# Patient Record
Sex: Male | Born: 1985 | Race: White | Hispanic: No | Marital: Married | Smoking: Never smoker
Health system: Southern US, Community
[De-identification: ages and names within clinical notes are randomized; demographics above are authoritative.]

## PROBLEM LIST (undated history)

## (undated) HISTORY — PX: FACIAL RECONSTRUCTION SURGERY: SHX631

---

## 2006-07-17 ENCOUNTER — Emergency Department: Payer: Self-pay | Admitting: Emergency Medicine
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
## 2012-12-30 ENCOUNTER — Emergency Department: Payer: Self-pay | Admitting: Emergency Medicine

## 2012-12-30 LAB — BASIC METABOLIC PANEL
Calcium, Total: 8.8 mg/dL (ref 8.5–10.1)
Chloride: 107 mmol/L (ref 98–107)
Creatinine: 1.05 mg/dL (ref 0.60–1.30)
EGFR (African American): >60
EGFR (Non-African Amer.): >60
Osmolality: 278 (ref 275–301)
Potassium: 4 mmol/L (ref 3.5–5.1)

## 2012-12-30 LAB — DRUG SCREEN, URINE
Amphetamines, Ur Screen: NEGATIVE (ref ?–1000)
Barbiturates, Ur Screen: NEGATIVE (ref ?–200)
Cocaine Metabolite,Ur ~~LOC~~: NEGATIVE (ref ?–300)
MDMA (Ecstasy)Ur Screen: NEGATIVE (ref ?–500)
Opiate, Ur Screen: NEGATIVE (ref ?–300)
Tricyclic, Ur Screen: NEGATIVE (ref ?–1000)

## 2012-12-30 LAB — CK TOTAL AND CKMB (NOT AT ARMC)
CK, Total: 626 U/L — ABNORMAL HIGH (ref 35–232)
CK-MB: 1.5 ng/mL (ref 0.5–3.6)

## 2012-12-30 LAB — CBC
HCT: 44.7 % (ref 40.0–52.0)
HGB: 15.6 g/dL (ref 13.0–18.0)
MCH: 30.2 pg (ref 26.0–34.0)
MCHC: 35 g/dL (ref 32.0–36.0)
MCV: 86 fL (ref 80–100)
Platelet: 212 10*3/uL (ref 150–440)
WBC: 7.5 10*3/uL (ref 3.8–10.6)

## 2012-12-30 LAB — TROPONIN I: Troponin-I: <0.02 ng/mL

## 2012-12-30 LAB — TSH: Thyroid Stimulating Horm: 3.24 u[IU]/mL

## 2014-01-12 IMAGING — CR DG CHEST 2V
1 series · 2 of 2 positions shown · non-contrast
Comparison: none

REASON FOR EXAM: Chest Pain
COMMENTS:

PROCEDURE:     DXR - DXR CHEST PA (OR AP) AND LATERAL  - December 30, 2012  [DATE]
RESULT:     The lungs are clear. The heart and pulmonary vessels are normal.
The bony and mediastinal structures are unremarkable. There is no effusion.
There is no pneumothorax or evidence of congestive failure.

[Series 1: w chest pa · 0.14mm/px · 2 of 2 slices shown]
[im 1/2]
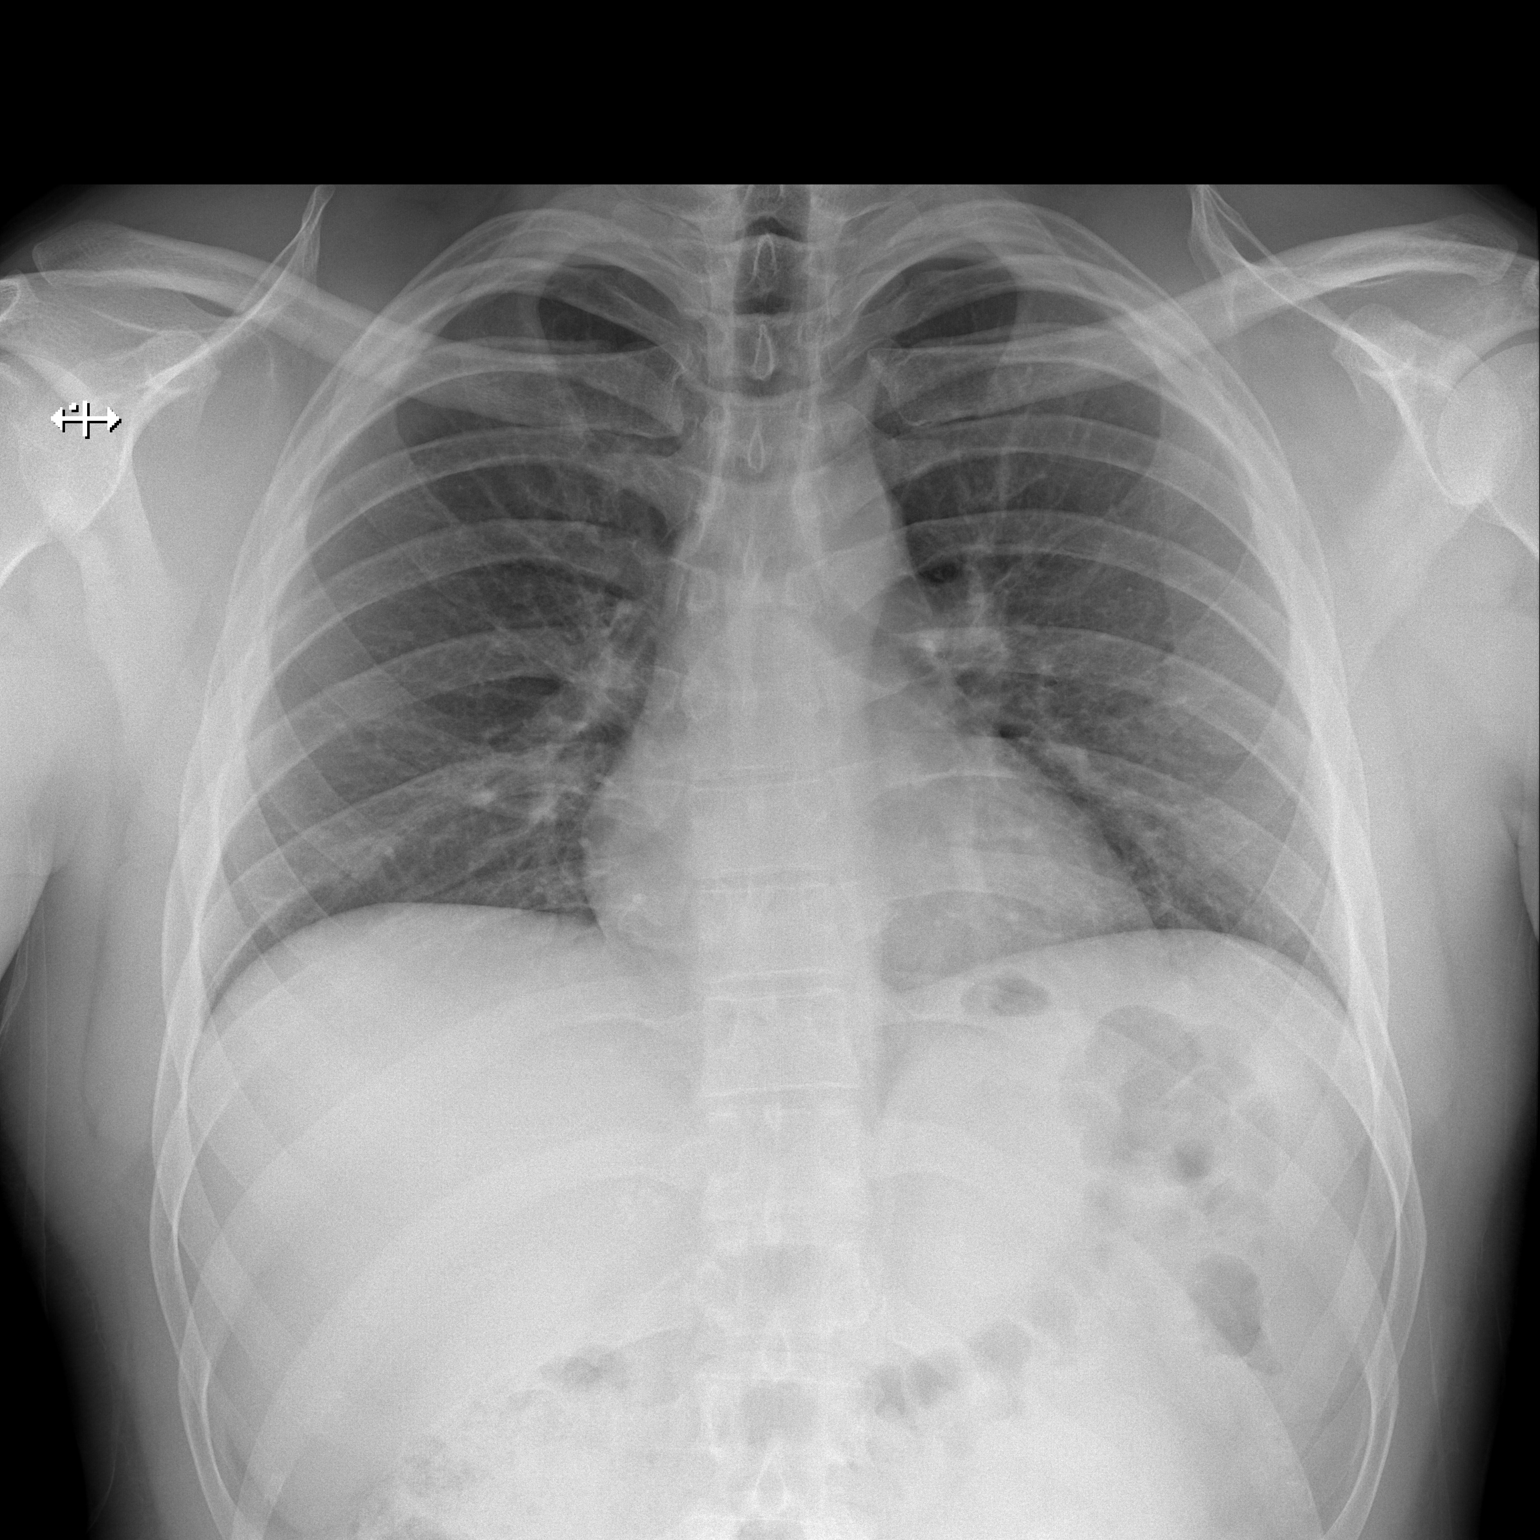
[im 2/2]
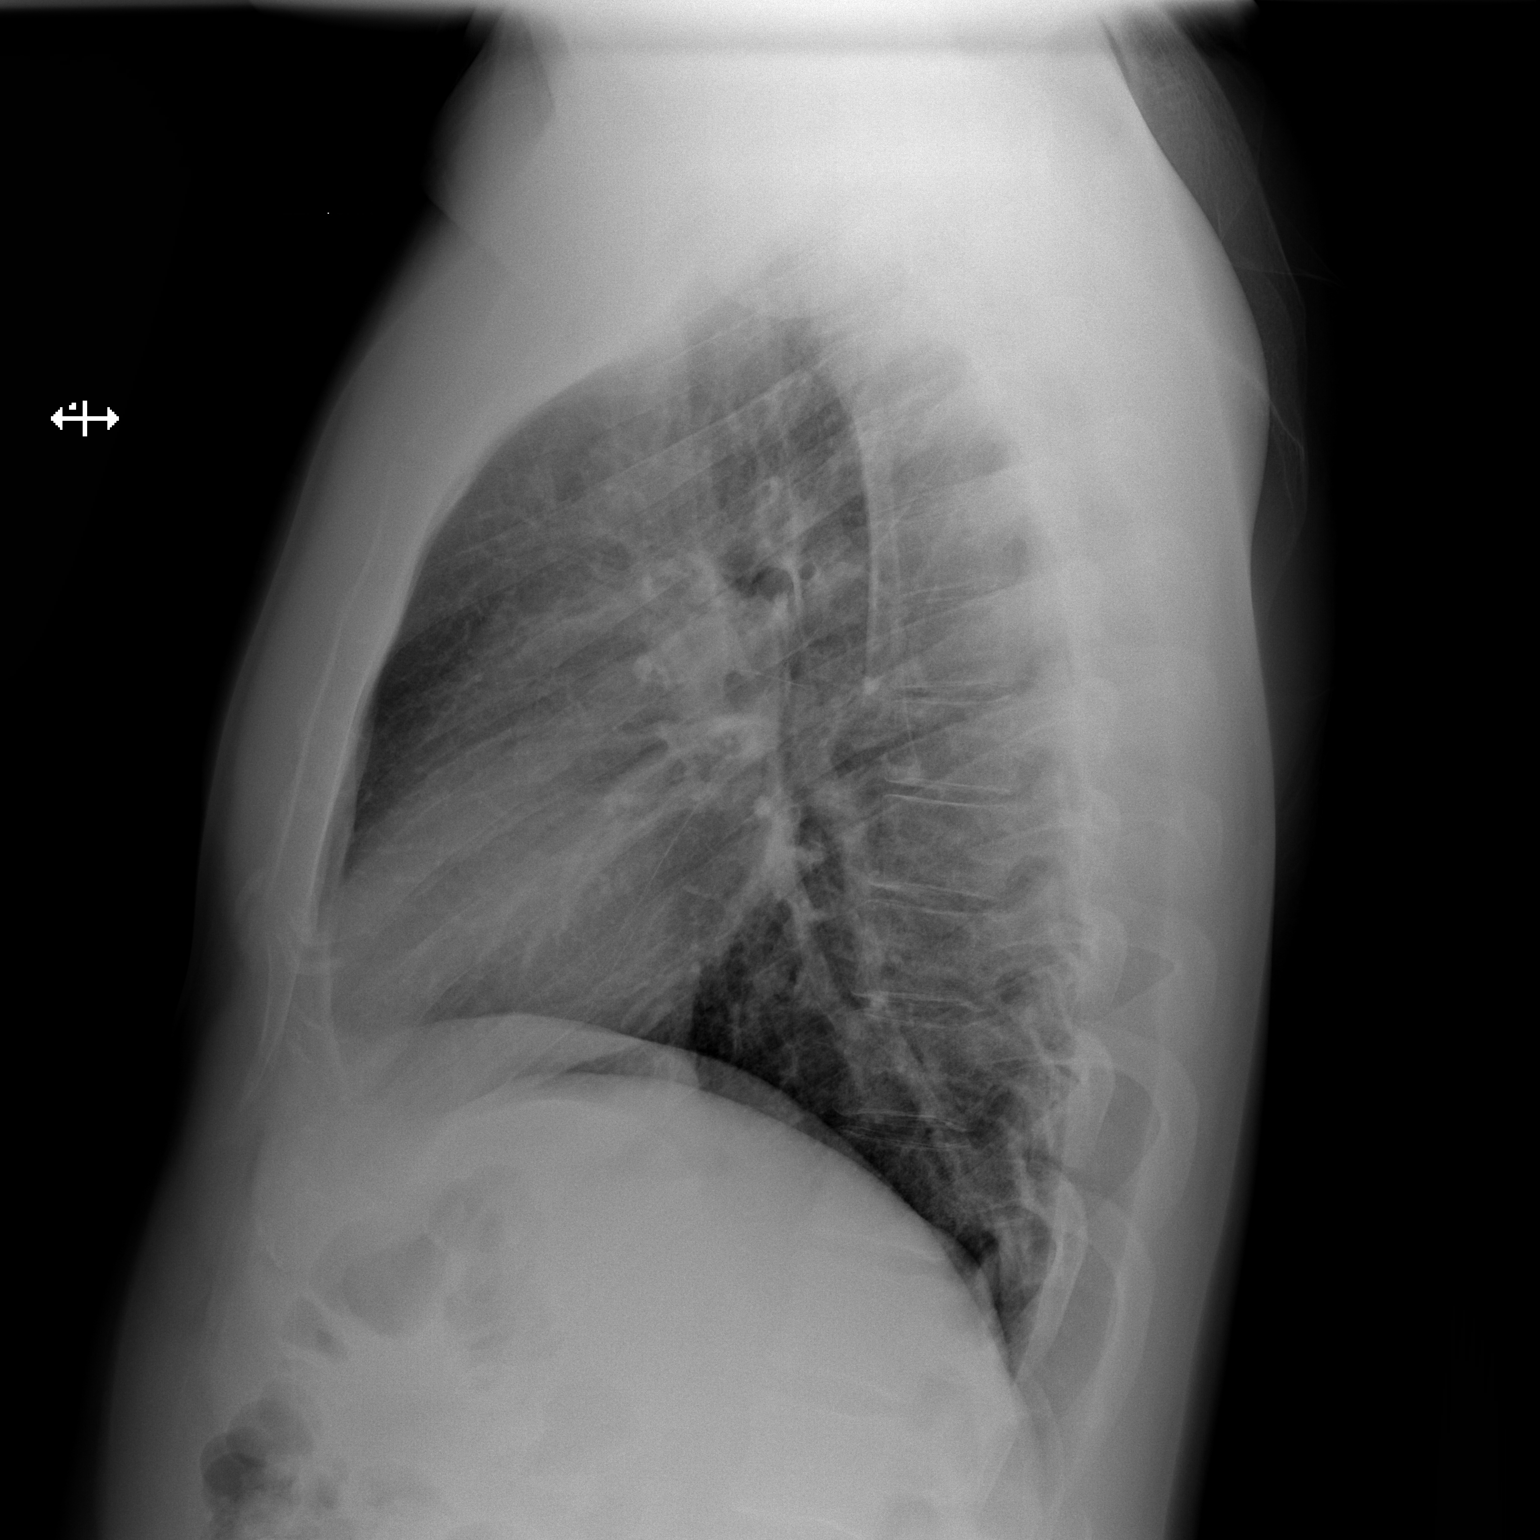

[2 of 2 positions shown; findings below may reference images not displayed]

IMPRESSION: No acute cardiopulmonary disease.

[REDACTED]

## 2015-05-03 ENCOUNTER — Emergency Department (HOSPITAL_COMMUNITY)
Admission: EM | Admit: 2015-05-03 | Discharge: 2015-05-03 | Discharge status: Absent without leave | Payer: 59 | Source: Home / Self Care

## 2016-09-14 DIAGNOSIS — S161XXA Strain of muscle, fascia and tendon at neck level, initial encounter: Secondary | ICD-10-CM | POA: Diagnosis not present

## 2016-09-14 DIAGNOSIS — S39012A Strain of muscle, fascia and tendon of lower back, initial encounter: Secondary | ICD-10-CM | POA: Diagnosis not present

## 2022-04-20 ENCOUNTER — Emergency Department (HOSPITAL_BASED_OUTPATIENT_CLINIC_OR_DEPARTMENT_OTHER): Payer: 59

## 2022-04-20 ENCOUNTER — Other Ambulatory Visit: Payer: Self-pay

## 2022-04-20 ENCOUNTER — Ambulatory Visit: Admission: EM | Admit: 2022-04-20 | Discharge: 2022-04-20 | Payer: 59

## 2022-04-20 ENCOUNTER — Emergency Department (HOSPITAL_BASED_OUTPATIENT_CLINIC_OR_DEPARTMENT_OTHER)
Admission: EM | Admit: 2022-04-20 | Discharge: 2022-04-20 | Disposition: A | Discharge status: Home / Self Care | Payer: 59 | Attending: Emergency Medicine | Admitting: Emergency Medicine

## 2022-04-20 ENCOUNTER — Encounter (HOSPITAL_BASED_OUTPATIENT_CLINIC_OR_DEPARTMENT_OTHER): Payer: Self-pay | Admitting: Emergency Medicine

## 2022-04-20 ENCOUNTER — Emergency Department (HOSPITAL_BASED_OUTPATIENT_CLINIC_OR_DEPARTMENT_OTHER): Payer: 59 | Admitting: Radiology

## 2022-04-20 DIAGNOSIS — S0083XA Contusion of other part of head, initial encounter: Secondary | ICD-10-CM | POA: Diagnosis not present

## 2022-04-20 DIAGNOSIS — M25532 Pain in left wrist: Secondary | ICD-10-CM

## 2022-04-20 DIAGNOSIS — S80212A Abrasion, left knee, initial encounter: Secondary | ICD-10-CM | POA: Insufficient documentation

## 2022-04-20 DIAGNOSIS — M79605 Pain in left leg: Secondary | ICD-10-CM

## 2022-04-20 DIAGNOSIS — S80211A Abrasion, right knee, initial encounter: Secondary | ICD-10-CM | POA: Diagnosis not present

## 2022-04-20 DIAGNOSIS — Z23 Encounter for immunization: Secondary | ICD-10-CM | POA: Insufficient documentation

## 2022-04-20 DIAGNOSIS — Y92410 Unspecified street and highway as the place of occurrence of the external cause: Secondary | ICD-10-CM | POA: Diagnosis not present

## 2022-04-20 DIAGNOSIS — M47812 Spondylosis without myelopathy or radiculopathy, cervical region: Secondary | ICD-10-CM | POA: Diagnosis not present

## 2022-04-20 DIAGNOSIS — S299XXA Unspecified injury of thorax, initial encounter: Secondary | ICD-10-CM | POA: Diagnosis not present

## 2022-04-20 DIAGNOSIS — S0081XA Abrasion of other part of head, initial encounter: Secondary | ICD-10-CM | POA: Diagnosis not present

## 2022-04-20 DIAGNOSIS — S0990XA Unspecified injury of head, initial encounter: Secondary | ICD-10-CM | POA: Diagnosis not present

## 2022-04-20 DIAGNOSIS — R4182 Altered mental status, unspecified: Secondary | ICD-10-CM | POA: Diagnosis not present

## 2022-04-20 DIAGNOSIS — T07XXXA Unspecified multiple injuries, initial encounter: Secondary | ICD-10-CM

## 2022-04-20 MED ORDER — CYCLOBENZAPRINE HCL 10 MG PO TABS: 10.0000 mg | ORAL_TABLET | Freq: Two times a day (BID) | ORAL | 0 refills | Status: AC | PRN

## 2022-04-20 MED ORDER — TETANUS-DIPHTH-ACELL PERTUSSIS 5-2.5-18.5 LF-MCG/0.5 IM SUSY
0.5000 mL | PREFILLED_SYRINGE | Freq: Once | INTRAMUSCULAR | Status: AC
  Administered 2022-04-20: 0.5 mL via INTRAMUSCULAR
  Filled 2022-04-20: qty 0.5

## 2022-04-20 MED ORDER — IBUPROFEN 800 MG PO TABS
800.0000 mg | ORAL_TABLET | Freq: Once | ORAL | Status: AC
  Administered 2022-04-20: 800 mg via ORAL
  Filled 2022-04-20: qty 1

## 2022-04-20 MED ORDER — IBUPROFEN 600 MG PO TABS: 600.0000 mg | ORAL_TABLET | Freq: Four times a day (QID) | ORAL | 0 refills | Status: AC | PRN
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
## 2022-04-20 NOTE — Discharge Instructions (Signed)
Please go to the emergency department as soon as you leave urgent care for further evaluation and management. ?
